# Patient Record
Sex: Female | Born: 1965 | Race: White | Hispanic: No | Marital: Married | Smoking: Current every day smoker
Health system: Southern US, Community
[De-identification: ages and names within clinical notes are randomized; demographics above are authoritative.]

## PROBLEM LIST (undated history)

## (undated) DIAGNOSIS — K5792 Diverticulitis of intestine, part unspecified, without perforation or abscess without bleeding: Secondary | ICD-10-CM

## (undated) HISTORY — PX: TONSILLECTOMY: SUR1361

## (undated) HISTORY — PX: TUBAL LIGATION: SHX77

---

## 2019-05-02 ENCOUNTER — Emergency Department: Payer: BLUE CROSS/BLUE SHIELD

## 2019-05-02 ENCOUNTER — Emergency Department
Admission: EM | Admit: 2019-05-02 | Discharge: 2019-05-02 | Disposition: A | Discharge status: Home / Self Care | Payer: BLUE CROSS/BLUE SHIELD | Attending: Emergency Medicine | Admitting: Emergency Medicine

## 2019-05-02 ENCOUNTER — Other Ambulatory Visit: Payer: Self-pay

## 2019-05-02 DIAGNOSIS — K5792 Diverticulitis of intestine, part unspecified, without perforation or abscess without bleeding: Secondary | ICD-10-CM | POA: Insufficient documentation

## 2019-05-02 DIAGNOSIS — F17210 Nicotine dependence, cigarettes, uncomplicated: Secondary | ICD-10-CM | POA: Insufficient documentation

## 2019-05-02 DIAGNOSIS — R1030 Lower abdominal pain, unspecified: Secondary | ICD-10-CM | POA: Diagnosis present

## 2019-05-02 HISTORY — DX: Diverticulitis of intestine, part unspecified, without perforation or abscess without bleeding: K57.92

## 2019-05-02 LAB — COMPREHENSIVE METABOLIC PANEL
ALT: 19 U/L (ref 0–44)
AST: 27 U/L (ref 15–41)
Albumin: 4 g/dL (ref 3.5–5.0)
Alkaline Phosphatase: 92 U/L (ref 38–126)
Anion gap: 8 (ref 5–15)
BUN: 19 mg/dL (ref 6–20)
CO2: 21 mmol/L — ABNORMAL LOW (ref 22–32)
Calcium: 8.9 mg/dL (ref 8.9–10.3)
Chloride: 112 mmol/L — ABNORMAL HIGH (ref 98–111)
Creatinine, Ser: 0.97 mg/dL (ref 0.44–1.00)
GFR calc Af Amer: >60 mL/min (ref >60)
GFR calc non Af Amer: >60 mL/min (ref >60)
Glucose, Bld: 114 mg/dL — ABNORMAL HIGH (ref 70–99)
Potassium: 5.2 mmol/L — ABNORMAL HIGH (ref 3.5–5.1)
Sodium: 141 mmol/L (ref 135–145)
Total Bilirubin: 1.3 mg/dL — ABNORMAL HIGH (ref 0.3–1.2)
Total Protein: 7.2 g/dL (ref 6.5–8.1)

## 2019-05-02 LAB — CBC WITH DIFFERENTIAL/PLATELET
Abs Immature Granulocytes: 0.02 10*3/uL (ref 0.00–0.07)
Basophils Absolute: 0 10*3/uL (ref 0.0–0.1)
Basophils Relative: 0 %
Eosinophils Absolute: 0 10*3/uL (ref 0.0–0.5)
Eosinophils Relative: 0 %
HCT: 40.8 % (ref 36.0–46.0)
Hemoglobin: 13.6 g/dL (ref 12.0–15.0)
Immature Granulocytes: 0 %
Lymphocytes Relative: 12 %
Lymphs Abs: 0.8 10*3/uL (ref 0.7–4.0)
MCH: 30.4 pg (ref 26.0–34.0)
MCHC: 33.3 g/dL (ref 30.0–36.0)
MCV: 91.1 fL (ref 80.0–100.0)
Monocytes Absolute: 0.4 10*3/uL (ref 0.1–1.0)
Monocytes Relative: 5 %
Neutro Abs: 6 10*3/uL (ref 1.7–7.7)
Neutrophils Relative %: 83 %
Platelets: 200 10*3/uL (ref 150–400)
RBC: 4.48 MIL/uL (ref 3.87–5.11)
RDW: 12.4 % (ref 11.5–15.5)
WBC: 7.2 10*3/uL (ref 4.0–10.5)
nRBC: 0 % (ref 0.0–0.2)

## 2019-05-02 LAB — URINALYSIS, COMPLETE (UACMP) WITH MICROSCOPIC
Bacteria, UA: NONE SEEN
Bilirubin Urine: NEGATIVE
Glucose, UA: NEGATIVE mg/dL
Hgb urine dipstick: NEGATIVE
Ketones, ur: NEGATIVE mg/dL
Nitrite: NEGATIVE
Protein, ur: NEGATIVE mg/dL
Specific Gravity, Urine: 1.021 (ref 1.005–1.030)
pH: 6 (ref 5.0–8.0)

## 2019-05-02 LAB — LIPASE, BLOOD: Lipase: 23 U/L (ref 11–51)

## 2019-05-02 MED ORDER — ONDANSETRON HCL 4 MG/2ML IJ SOLN
4.0000 mg | Freq: Once | INTRAMUSCULAR | Status: AC
  Administered 2019-05-02: 10:00:00 4 mg via INTRAVENOUS
  Filled 2019-05-02: qty 2

## 2019-05-02 MED ORDER — OXYCODONE-ACETAMINOPHEN 5-325 MG PO TABS: 1.0000 | ORAL_TABLET | ORAL | 0 refills | Status: AC | PRN

## 2019-05-02 MED ORDER — CIPROFLOXACIN HCL 500 MG PO TABS: 500.0000 mg | ORAL_TABLET | Freq: Two times a day (BID) | ORAL | 0 refills | Status: AC

## 2019-05-02 MED ORDER — MORPHINE SULFATE (PF) 4 MG/ML IV SOLN
4.0000 mg | Freq: Once | INTRAVENOUS | Status: AC
  Administered 2019-05-02: 4 mg via INTRAVENOUS
  Filled 2019-05-02: qty 1

## 2019-05-02 MED ORDER — DOCUSATE SODIUM 100 MG PO CAPS: 100.0000 mg | ORAL_CAPSULE | Freq: Every day | ORAL | 0 refills | Status: AC

## 2019-05-02 MED ORDER — METRONIDAZOLE 500 MG PO TABS: 500.0000 mg | ORAL_TABLET | Freq: Two times a day (BID) | ORAL | 0 refills | Status: AC

## 2019-05-02 MED ORDER — SODIUM CHLORIDE 0.9 % IV SOLN
1000.0000 mL | Freq: Once | INTRAVENOUS | Status: AC
  Administered 2019-05-02: 1000 mL via INTRAVENOUS

## 2019-05-02 NOTE — ED Triage Notes (Signed)
Pt c/o lower abd pain since Thursday with nausea. States "I think its my diverticulitis"

## 2019-05-02 NOTE — ED Notes (Signed)
Md at bedside to update patient on lab work. Bolus almost complete at this time and pt will be discharged home with her husband as transportation.

## 2019-05-02 NOTE — ED Provider Notes (Signed)
[facility] Emergency Department Provider Note   ____________________________________________    I have reviewed the triage vital signs and the nursing notes.   HISTORY  Chief Complaint Abdominal Pain     HPI Natasha Dunlap is a 53 y.o. female who presents with bilateral lower abdominal pain.  Patient reports that started 3 days ago but is gradually gotten worse.  She reports this feels similar to prior episodes of diverticulitis.  She is never had surgery for diverticulitis.  She denies fevers or chills.  No nausea or vomiting.  Has taken naproxen with little relief.  She reports in the past typically she is requiring antibiotics and pain medication to get better.  No recent travel.  No myalgias or exposure to COVID-19 positive patients  Past Medical History:  Diagnosis Date  . Diverticulitis     There are no active problems to display for this patient.   Past Surgical History:  Procedure Laterality Date  . TONSILLECTOMY    . TUBAL LIGATION      Prior to Admission medications   Medication Sig Start Date End Date Taking? Authorizing Provider  ciprofloxacin (CIPRO) 500 MG tablet Take 1 tablet (500 mg total) by mouth 2 (two) times daily. 05/02/19   Jene Every, MD  docusate sodium (COLACE) 100 MG capsule Take 1 capsule (100 mg total) by mouth daily for 10 days. 05/02/19 05/12/19  Jene Every, MD  metroNIDAZOLE (FLAGYL) 500 MG tablet Take 1 tablet (500 mg total) by mouth 2 (two) times daily after a meal. 05/02/19   Jene Every, MD  oxyCODONE-acetaminophen (PERCOCET) 5-325 MG tablet Take 1 tablet by mouth every 4 (four) hours as needed for severe pain. 05/02/19 05/01/20  Jene Every, MD     Allergies Patient has no known allergies.  No family history on file.  Social History Social History   Tobacco Use  . Smoking status: Current Every Day Smoker    Types: Cigarettes  . Smokeless tobacco: Never Used  Substance Use Topics  . Alcohol  use: Not Currently  . Drug use: Not Currently    Review of Systems  Constitutional: No fever/chills Eyes: No visual changes.  ENT: No sore throat. Cardiovascular: Denies chest pain. Respiratory: Denies shortness of breath. Gastrointestinal: As above Genitourinary: Negative for dysuria. Musculoskeletal: No myalgias Skin: Negative for rash. Neurological: Negative for headaches   ____________________________________________   PHYSICAL EXAM:  VITAL SIGNS: ED Triage Vitals  Enc Vitals Group     BP 05/02/19 0922 (!) 132/92     Pulse Rate 05/02/19 0922 98     Resp 05/02/19 0922 20     Temp 05/02/19 0922 98.2 F (36.8 C)     Temp Source 05/02/19 0922 Oral     SpO2 05/02/19 0922 100 %     Weight 05/02/19 0918 81.6 kg (180 lb)     Height 05/02/19 0918 1.626 m (5\' 4" )     Head Circumference --      Peak Flow --      Pain Score 05/02/19 0918 9     Pain Loc --      Pain Edu? --      Excl. in GC? --     Constitutional: Alert and oriented.  Eyes: Conjunctivae are normal.   Nose: No congestion/rhinnorhea. Mouth/Throat: Mucous membranes are moist.    Cardiovascular: Normal rate, regular rhythm. Grossly normal heart sounds.  Good peripheral circulation. Respiratory: Normal respiratory effort.  No retractions. Lungs CTAB. Gastrointestinal: Tenderness palpation left  lower quadrant right lower quadrant, left greater than right no distention  Musculoskeletal: Warm and well perfused Neurologic:  Normal speech and language. No gross focal neurologic deficits are appreciated.  Skin:  Skin is warm, dry and intact. No rash noted. Psychiatric: Mood and affect are normal. Speech and behavior are normal.  ____________________________________________   LABS (all labs ordered are listed, but only abnormal results are displayed)  Labs Reviewed  COMPREHENSIVE METABOLIC PANEL - Abnormal; Notable for the following components:      Result Value   Potassium 5.2 (*)    Chloride 112 (*)     CO2 21 (*)    Glucose, Bld 114 (*)    Total Bilirubin 1.3 (*)    All other components within normal limits  URINALYSIS, COMPLETE (UACMP) WITH MICROSCOPIC - Abnormal; Notable for the following components:   Color, Urine AMBER (*)    APPearance CLEAR (*)    Leukocytes,Ua SMALL (*)    Non Squamous Epithelial PRESENT (*)    All other components within normal limits  CBC WITH DIFFERENTIAL/PLATELET  LIPASE, BLOOD   ____________________________________________  EKG  None ____________________________________________  RADIOLOGY  CT consistent with diverticulitis, no abscess or perforation ____________________________________________   PROCEDURES  Procedure(s) performed: No  Procedures   Critical Care performed: No ____________________________________________   INITIAL IMPRESSION / ASSESSMENT AND PLAN / ED COURSE  Pertinent labs & imaging results that were available during my care of the patient were reviewed by me and considered in my medical decision making (see chart for details).  Presents with bilateral lower abdominal pain, left greater than right, she is tender in this area as well, given her history certainly is suspicious for diverticulitis, urinary tract infection is also a possibility.  We will obtain labs, urine give IV morphine, IV Zofran, obtain CT abdomen pelvis and reevaluate.   CT scan is consistent with acute sigmoid diverticulitis, no perforation.  Patient is feeling better after morphine.  Pending the remainder of her labs however Walt blood cell count is reassuring.  Lab work unremarkable, patient has received IV fluids, feels better will discharge with Cipro Flagyl, oxycodone, Colace    ____________________________________________   FINAL CLINICAL IMPRESSION(S) / ED DIAGNOSES  Final diagnoses:  Diverticulitis        Note:  This document was prepared using Dragon voice recognition software and may include unintentional dictation errors.    Jene EveryKinner, Denzell Colasanti, MD 05/02/19 1154

## 2019-05-02 NOTE — ED Notes (Signed)
Patient face mask was damaged. Patient offered replacement mask.

## 2019-05-30 NOTE — Progress Notes (Deleted)
   Subjective:    Patient ID: Meia Emley, female    DOB: 09/16/1966, 53 y.o.   MRN: [id number]  HPI:  Ms. Mounsey is here to establish as a new pt.  She is a pleasant 53 year old female. PMH: Diverticullitis with most recent exacerbation May 2020-   Patient Care Team    Relationship Specialty Notifications Start End  Patient, No Pcp Per PCP - [facility]  05/02/19     There are no active problems to display for this patient.    Past Medical History:  Diagnosis Date  . Diverticulitis      Past Surgical History:  Procedure Laterality Date  . TONSILLECTOMY    . TUBAL LIGATION       No family history on file.   Social History   Substance and Sexual Activity  Drug Use Not Currently     Social History   Substance and Sexual Activity  Alcohol Use Not Currently     Social History   Tobacco Use  Smoking Status Current Every Day Smoker  . Types: Cigarettes  Smokeless Tobacco Never Used     Outpatient Encounter Medications as of 05/31/2019  Medication Sig  . ciprofloxacin (CIPRO) 500 MG tablet Take 1 tablet (500 mg total) by mouth 2 (two) times daily.  . metroNIDAZOLE (FLAGYL) 500 MG tablet Take 1 tablet (500 mg total) by mouth 2 (two) times daily after a meal.  . oxyCODONE-acetaminophen (PERCOCET) 5-325 MG tablet Take 1 tablet by mouth every 4 (four) hours as needed for severe pain.   No facility-administered encounter medications on file as of 05/31/2019.     Allergies: Patient has no known allergies.  There is no height or weight on file to calculate BMI.  There were no vitals taken for this visit.     Review of Systems     Objective:   Physical Exam        Assessment & Plan:  No diagnosis found.  No problem-specific Assessment & Plan notes found for this encounter.    FOLLOW-UP:  No follow-ups on file.

## 2019-05-31 ENCOUNTER — Ambulatory Visit: Payer: Self-pay | Admitting: Adult Health

## 2020-02-18 ENCOUNTER — Other Ambulatory Visit: Payer: Self-pay

## 2020-02-18 DIAGNOSIS — Z20822 Contact with and (suspected) exposure to covid-19: Secondary | ICD-10-CM

## 2020-02-20 LAB — NOVEL CORONAVIRUS, NAA: SARS-CoV-2, NAA: NOT DETECTED

## 2021-02-13 IMAGING — CT CT ABDOMEN AND PELVIS WITHOUT CONTRAST
2 of 4 series · 16 of 46 positions shown, 18 images · non-contrast
Comparison: None.

CLINICAL DATA: 52 year-old female with lower abdominal pain and
nausea for 5 days.

EXAM:
CT ABDOMEN AND PELVIS WITHOUT CONTRAST
TECHNIQUE: Multidetector CT imaging of the abdomen and pelvis was performed
following the standard protocol without IV contrast.

[Series 2: routine abd/pel wo · axial · 0.83mm/px · z∈[-530,-85]mm · 13 of 99 slices shown, 15 images]
[im 5/99  soft-tissue]
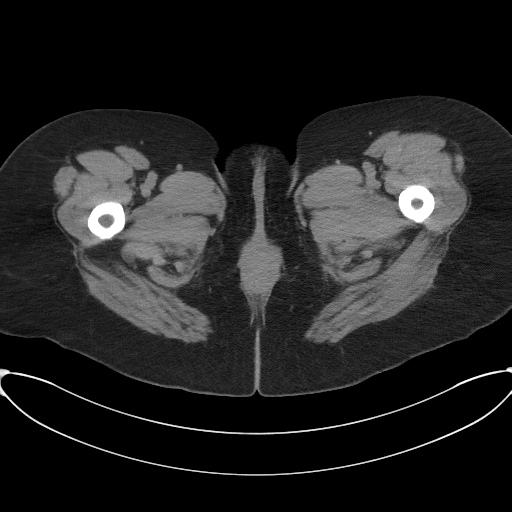
[im 5/99  bone]
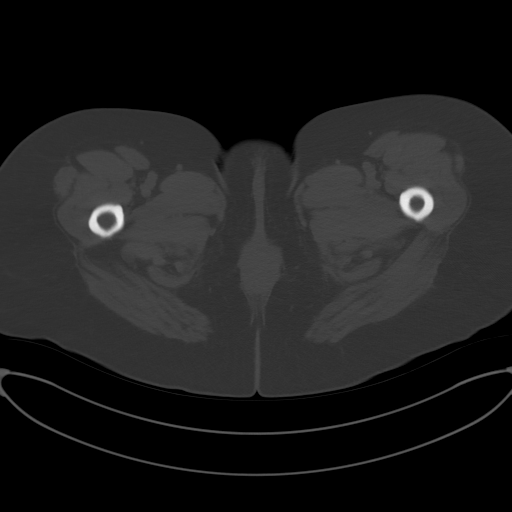
[im 13/99  soft-tissue]
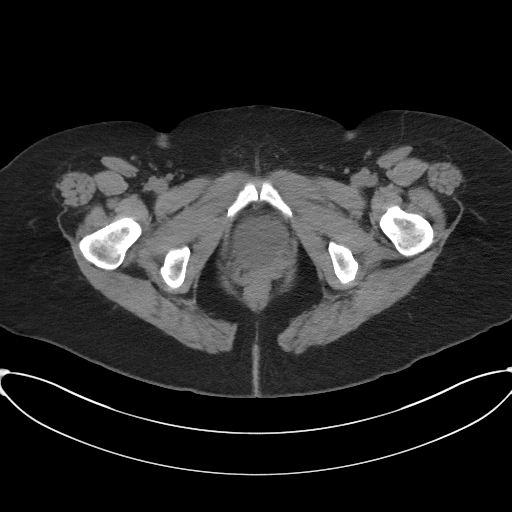
[im 22/99  soft-tissue]
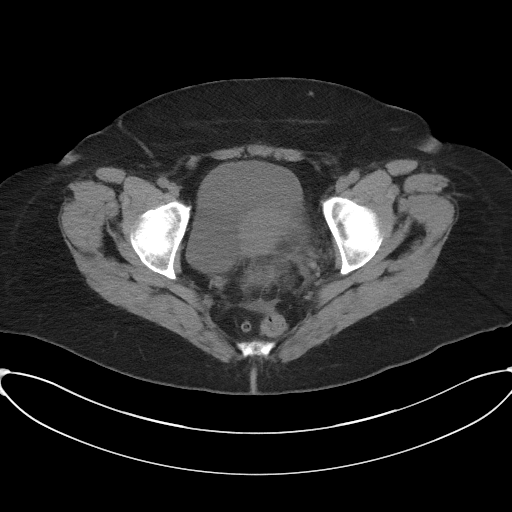
[im 26/99  soft-tissue]
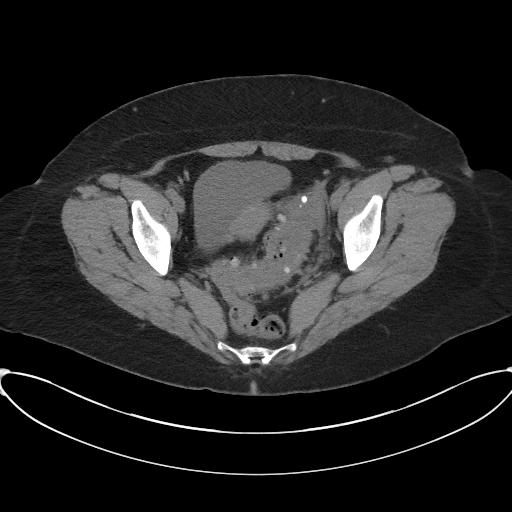
[im 35/99  soft-tissue]
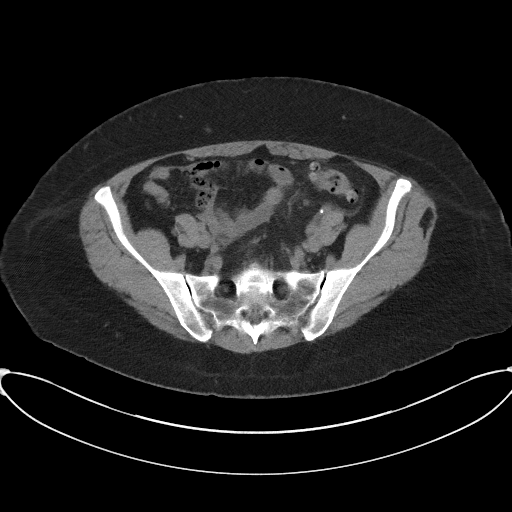
[im 43/99  soft-tissue]
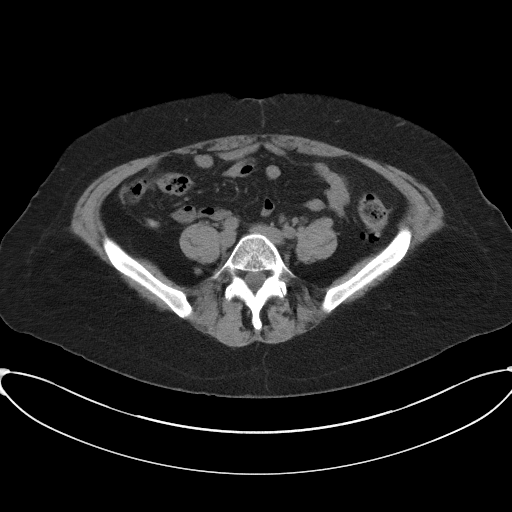
[im 52/99  soft-tissue]
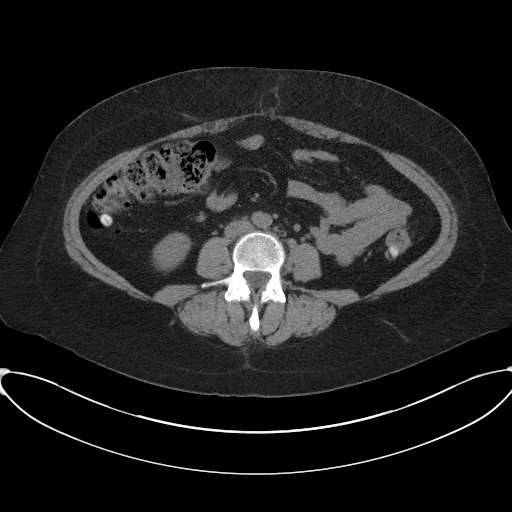
[im 56/99  soft-tissue]
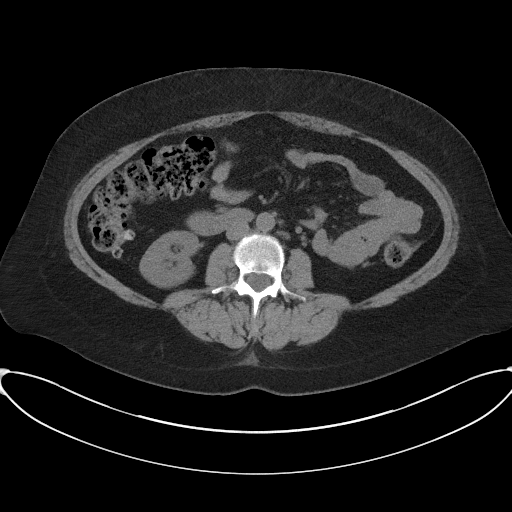
[im 64/99  soft-tissue]
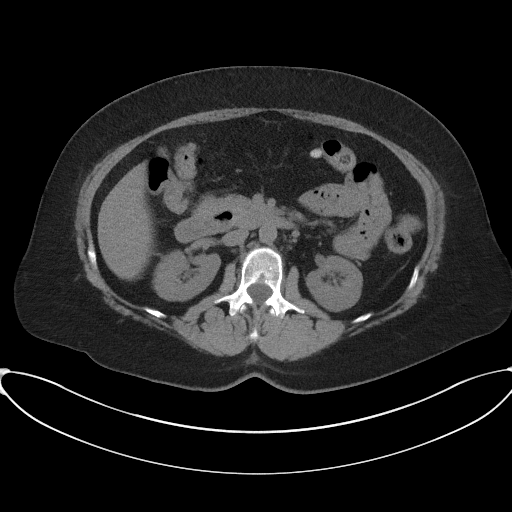
[im 64/99  bone]
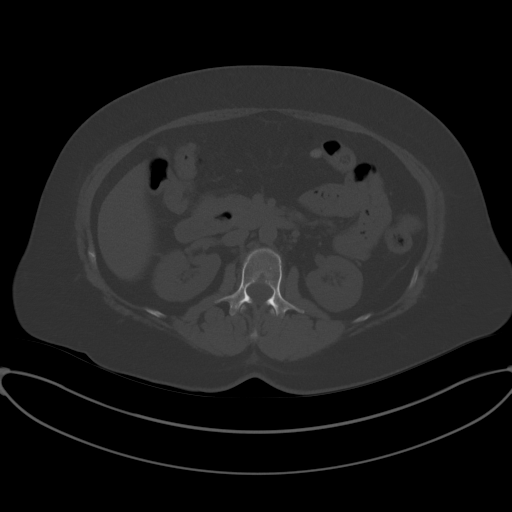
[im 73/99  soft-tissue]
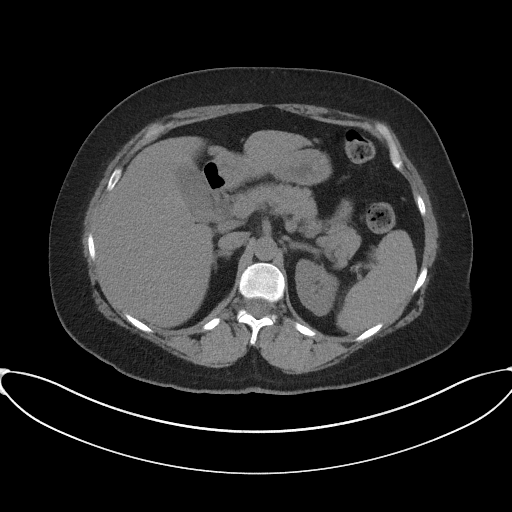
[im 77/99  soft-tissue]
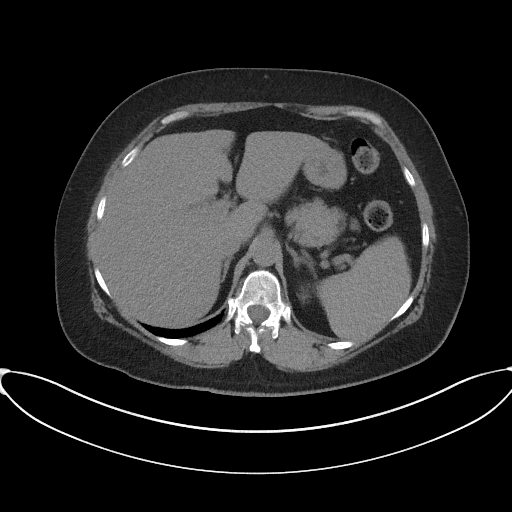
[im 86/99  soft-tissue]
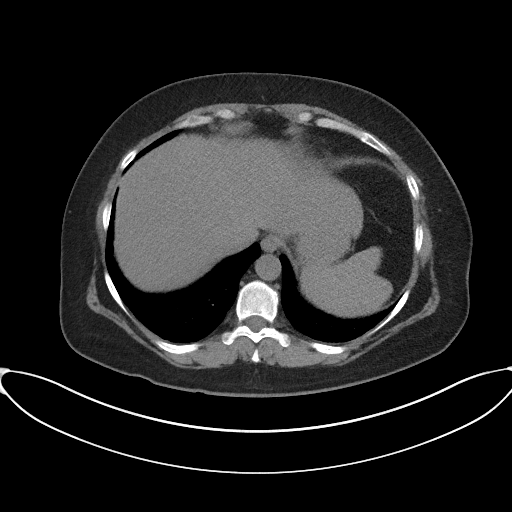
[im 94/99  soft-tissue]
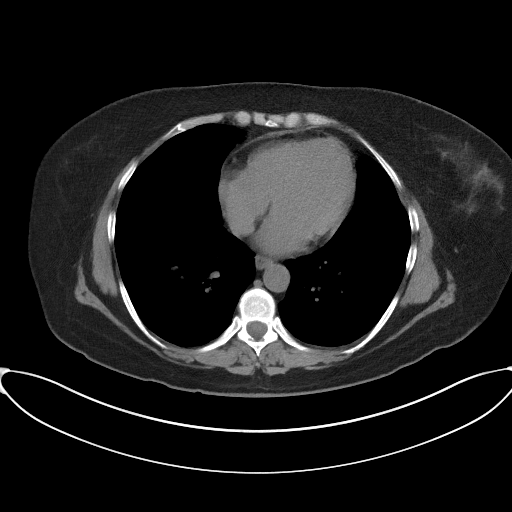

[Series 5: coronal st · coronal · 0.95mm/px · 3 of 87 slices shown]
[im 29/87  soft-tissue]
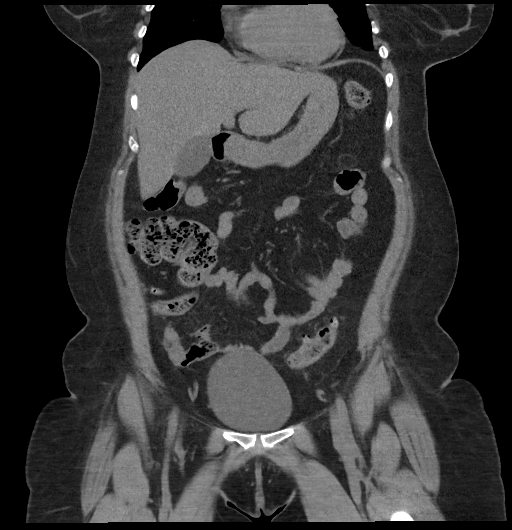
[im 39/87  soft-tissue]
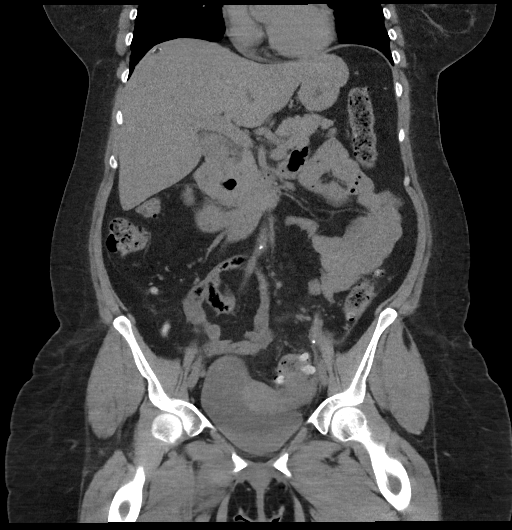
[im 48/87  soft-tissue]
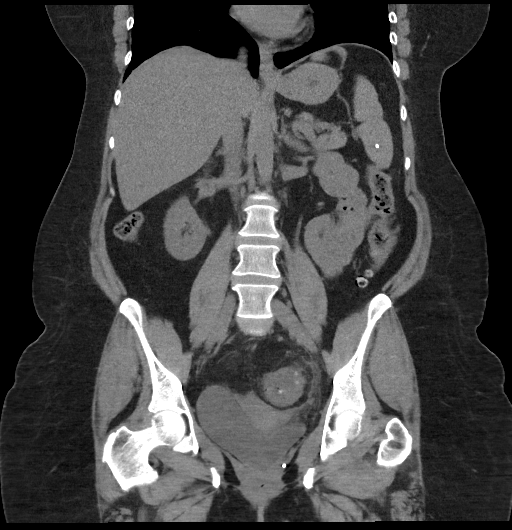

[16 of 46 positions shown; findings below may reference images not displayed]

FINDINGS: Lower chest: No acute abnormality.

Hepatobiliary: Circumscribed 0.8 x 0.8 cm low-attenuation lesion in
the lateral periphery of the hepatic dome. This lesion is
incompletely characterized in the absence of intravenous contrast
but appears to demonstrate faint peripheral calcification. The
remainder the liver is normal in caliber and morphology. No
additional lesions are identified. The gallbladder is normal in
appearance. No intra or extrahepatic biliary ductal dilatation.

Pancreas: Unremarkable. No pancreatic ductal dilatation or
surrounding inflammatory changes.

Spleen: Punctate calcifications present within the spleen consistent
with old granulomatous disease.

Adrenals/Urinary Tract: The adrenal glands are normal. The left
kidney is unremarkable in appearance. A 5 mm stone is present within
the interpolar collecting system on the right. No evidence of
hydronephrosis. The ureters and bladder are unremarkable.

Stomach/Bowel: Submucosal thickening is present along the sigmoid
colon in a region of numerous diverticula. Additionally, there is
interstitial stranding in the pericolonic fat and a trace amount of
fluid layering in the pelvic cul-de-sac. No evidence of free air or
defined fluid collection to suggest micro perforation. The imaging
appearance is most consistent with acute uncomplicated sigmoid
diverticulitis. Additional diverticula are present scattered
throughout the colon. The appendix is normal. The terminal ileum is
unremarkable.

Vascular/Lymphatic: Limited evaluation in the absence of intravenous
contrast. No evidence of aneurysm or significant atherosclerotic
plaque. No suspicious lymphadenopathy.

Reproductive: Uterus and bilateral adnexa are unremarkable.

Other: Small fat containing anterior abdominal wall hernia just
superior to the umbilicus.

Musculoskeletal: No acute fracture or aggressive appearing lytic or
blastic osseous lesion.
IMPRESSION: 1. Imaging findings are consistent with acute uncomplicated sigmoid
diverticulitis.
2. Subcentimeter circumscribed low-density lesion in the lateral
periphery of the hepatic dome with faint peripheral calcification.
Although this lesion is incompletely characterized in the absence of
intravenous contrast, in the absence of a known primary malignancy
this cyst is statistically highly likely to represent a minimally
complex cyst or hemangioma.
3. 5 mm right interpolar renal stone without evidence of
obstruction.
4. Small fat containing supraumbilical anterior abdominal wall
hernia.
